# Patient Record
Sex: Female | Born: 1985 | Race: White | Hispanic: No
Health system: Southern US, Community
[De-identification: ages and names within clinical notes are randomized; demographics above are authoritative.]

---

## 2006-12-19 ENCOUNTER — Encounter: Admission: RE | Admit: 2006-12-19 | Discharge: 2006-12-19 | Payer: Self-pay | Admitting: Family Medicine

## 2007-01-19 ENCOUNTER — Encounter: Admission: RE | Admit: 2007-01-19 | Discharge: 2007-01-19 | Payer: Self-pay | Admitting: Family Medicine

## 2007-01-24 ENCOUNTER — Encounter: Admission: RE | Admit: 2007-01-24 | Discharge: 2007-01-24 | Payer: Self-pay | Admitting: Family Medicine

## 2008-02-27 IMAGING — CR DG CHEST 2V
2 series · 2 of 2 positions shown · non-contrast
Comparison: 12/19/06

CLINICAL DATA: Follow-up pneumonia.
 CHEST - 2 VIEW:

[view not recorded (1 of 2)]
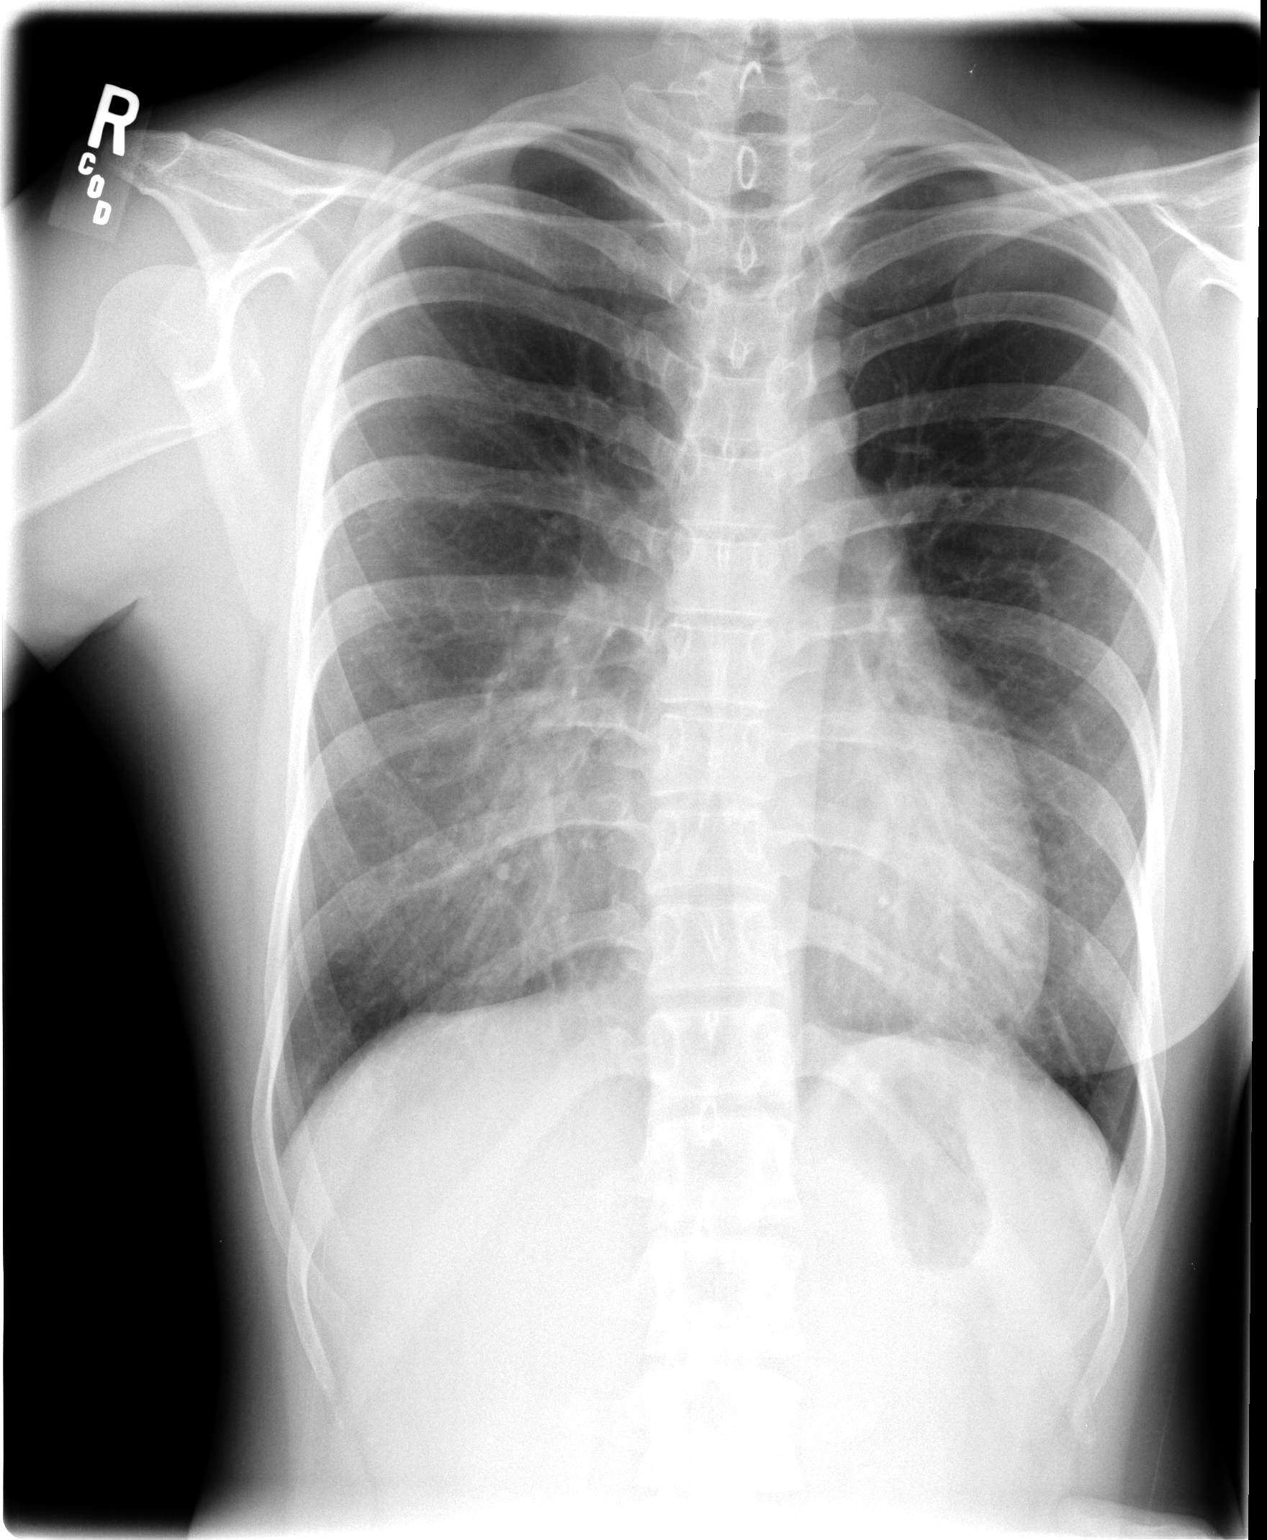

[view not recorded (2 of 2)]
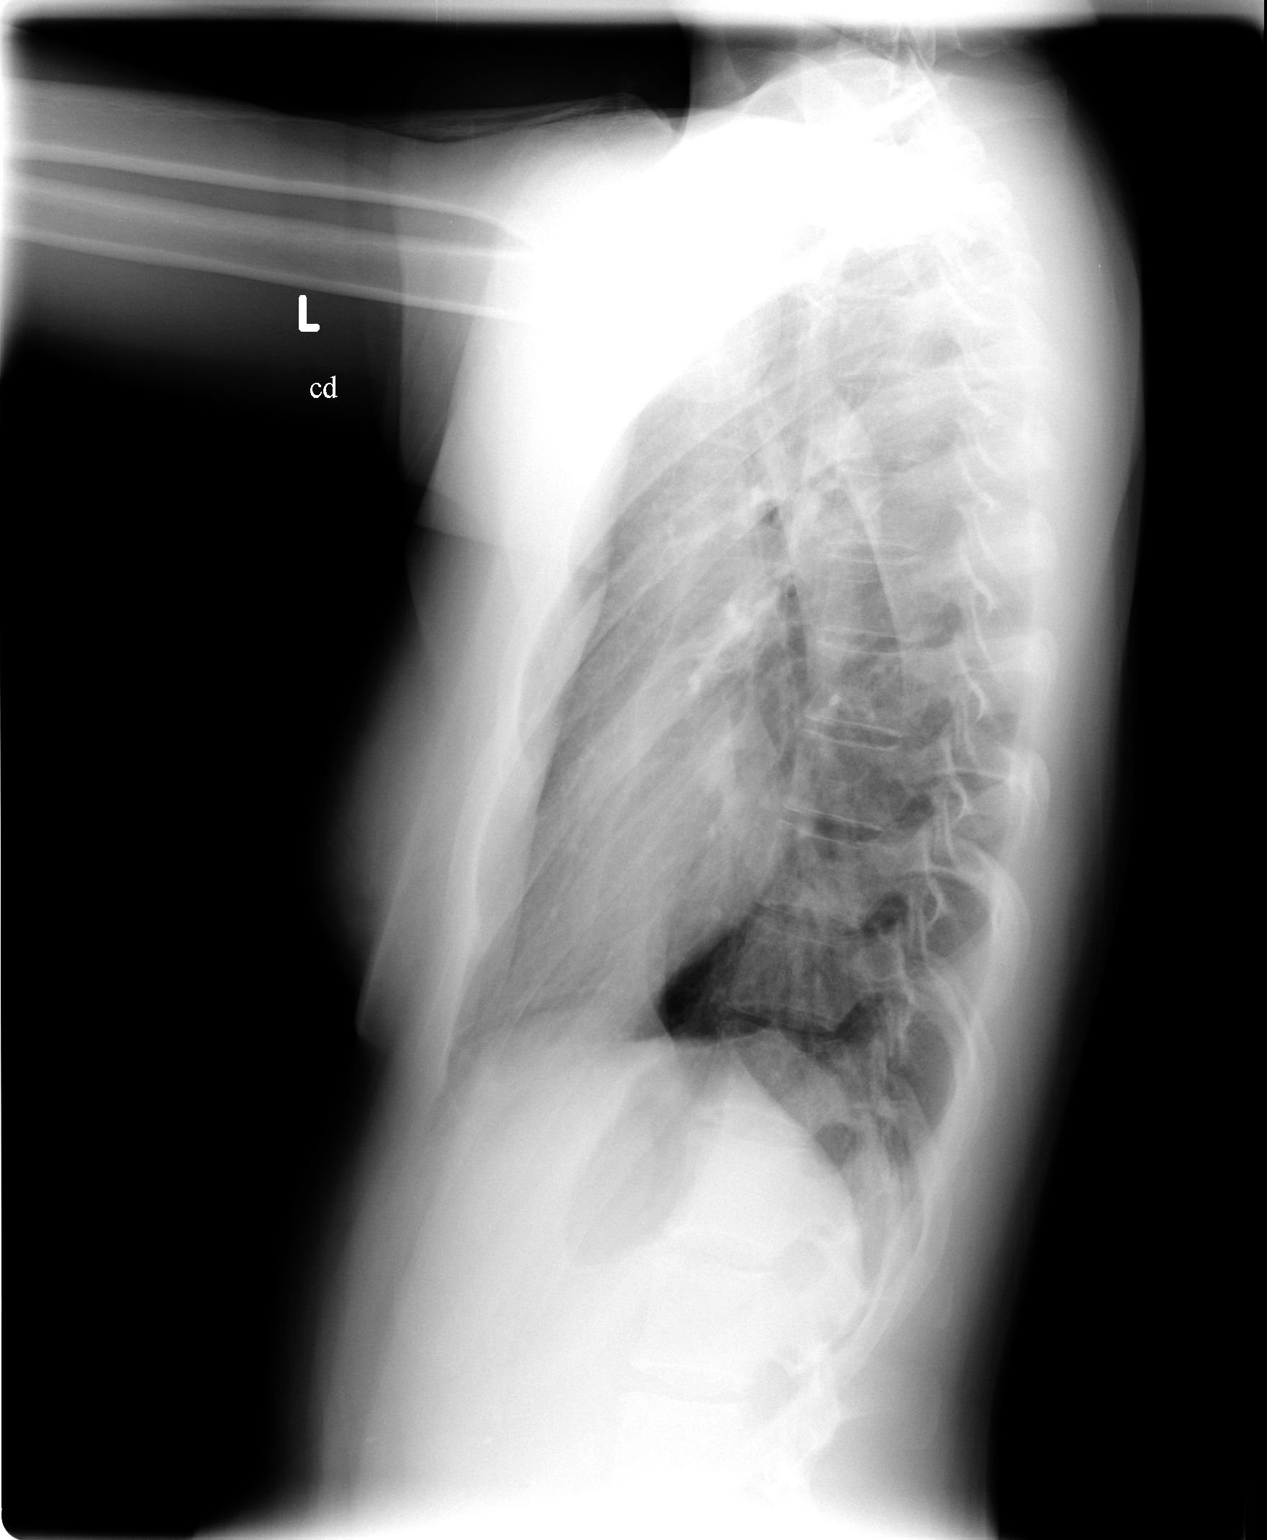

[2 of 2 positions shown; findings below may reference images not displayed]

FINDINGS: The right heart border remains obscured.  This may be, however, due to pectus deformity rather than right middle lobe pneumonia. The right lower lobe aeration has improved.  
 If it is important to determine whether or not the right middle lobe has cleared, CT will be necessary, unless there are old chest x-rays available for comparison.
IMPRESSION: 1.  Right lower lobe pneumonia essentially resolved. 
 2.  The right heart border remains obscured.  This may be due to pectus deformity rather than pneumonia.  See report.

## 2008-04-03 ENCOUNTER — Encounter: Admission: RE | Admit: 2008-04-03 | Discharge: 2008-04-03 | Payer: Self-pay | Admitting: Family Medicine

## 2009-05-12 IMAGING — CR DG WRIST COMPLETE 3+V*R*
4 series · 4 of 4 positions shown · non-contrast
Comparison: None

CLINICAL DATA: Lateral wrist pain for several weeks.  Recent
injury.

RIGHT WRIST - COMPLETE 3+ VIEW

[x wrist pa right]
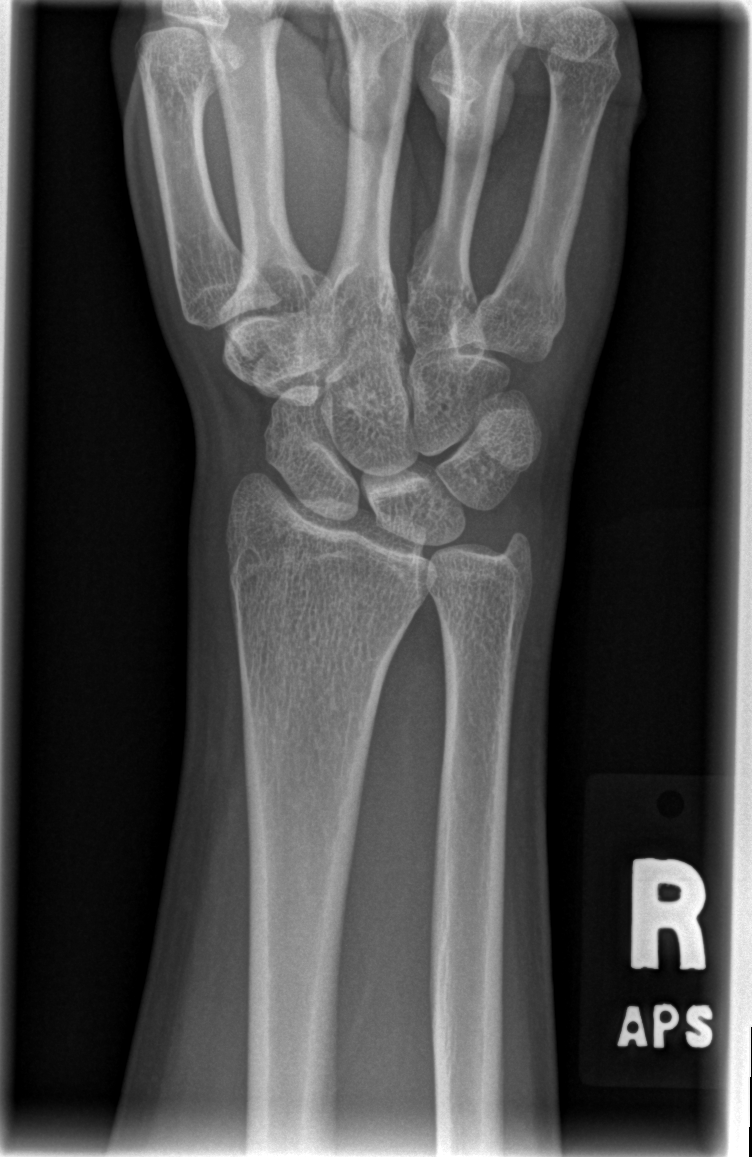

[x wrist obl right]
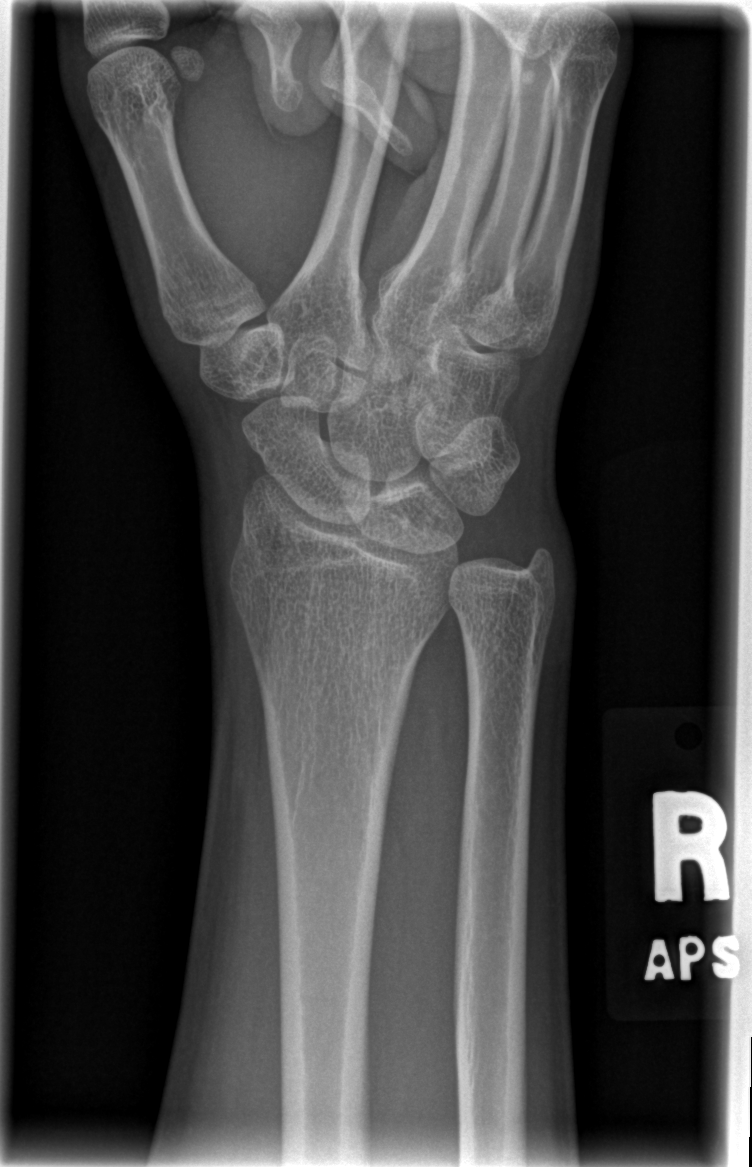

[x wrist lat right]
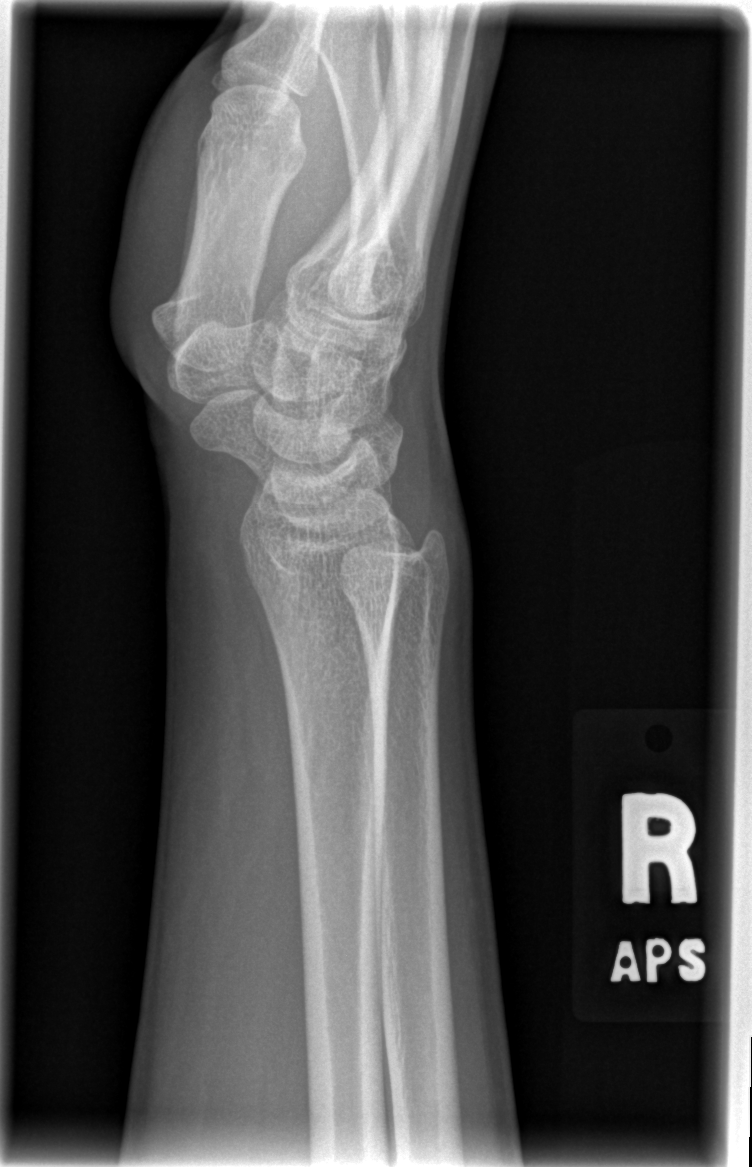

[x navicular]
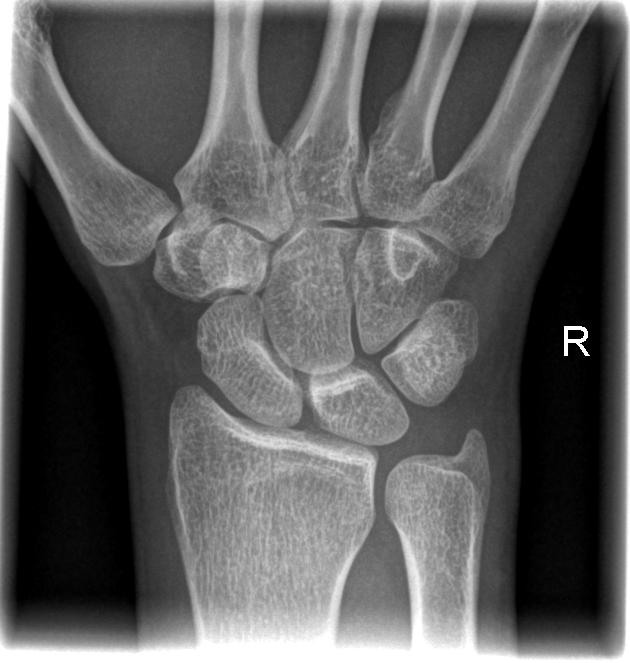

[4 of 4 positions shown; findings below may reference images not displayed]

FINDINGS: Mineralization and alignment are normal.  There is no
evidence of acute fracture or dislocation.  No focal soft tissue
swelling is evident.
IMPRESSION: No acute osseous findings.

## 2019-01-29 ENCOUNTER — Other Ambulatory Visit: Payer: Self-pay

## 2019-01-29 DIAGNOSIS — Z20822 Contact with and (suspected) exposure to covid-19: Secondary | ICD-10-CM

## 2019-01-30 LAB — NOVEL CORONAVIRUS, NAA: SARS-CoV-2, NAA: NOT DETECTED

## 2019-04-05 ENCOUNTER — Ambulatory Visit: Payer: Self-pay | Attending: Internal Medicine

## 2019-04-05 ENCOUNTER — Other Ambulatory Visit: Payer: Self-pay

## 2019-04-05 DIAGNOSIS — Z23 Encounter for immunization: Secondary | ICD-10-CM | POA: Insufficient documentation

## 2019-04-05 NOTE — Progress Notes (Signed)
   Covid-19 Vaccination Clinic  Name:  Hannahgrace Lalli    MRN: [id number] DOB: [birth year]  04/05/2019  Ms. Flinders was observed post Covid-19 immunization for 15 minutes without incidence. She was provided with Vaccine Information Sheet and instruction to access the V-Safe system.   Ms. Uncapher was instructed to call 911 with any severe reactions post vaccine: Marland Kitchen Difficulty breathing  . Swelling of your face and throat  . A fast heartbeat  . A bad rash all over your body  . Dizziness and weakness    Immunizations Administered    Name Date Dose VIS Date Route   Moderna COVID-19 Vaccine 04/05/2019  3:15 PM 0.5 mL 01/09/2019 Intramuscular   Manufacturer: Moderna   Lot: [id number]   NDC: 88891-694-50

## 2019-05-08 ENCOUNTER — Ambulatory Visit: Payer: Self-pay | Attending: Family

## 2019-05-08 DIAGNOSIS — Z23 Encounter for immunization: Secondary | ICD-10-CM

## 2019-05-08 NOTE — Progress Notes (Signed)
   Covid-19 Vaccination Clinic  Name:  Aly Seidenberg    MRN: [id number] DOB: [birth year]  05/08/2019  Ms. Ozawa was observed post Covid-19 immunization for 15 minutes without incident. She was provided with Vaccine Information Sheet and instruction to access the V-Safe system.   Ms. Wernli was instructed to call 911 with any severe reactions post vaccine: Marland Kitchen Difficulty breathing  . Swelling of face and throat  . A fast heartbeat  . A bad rash all over body  . Dizziness and weakness   Immunizations Administered    Name Date Dose VIS Date Route   Moderna COVID-19 Vaccine 05/08/2019 12:37 PM 0.5 mL 01/09/2019 Intramuscular   Manufacturer: Moderna   Lot: [id number]   NDC: 89784-784-12

## 2019-05-10 ENCOUNTER — Ambulatory Visit: Payer: Self-pay

## 2021-05-18 ENCOUNTER — Ambulatory Visit: Payer: BC Managed Care – PPO | Admitting: Podiatry

## 2021-05-18 ENCOUNTER — Encounter: Payer: Self-pay | Admitting: Podiatry

## 2021-05-18 ENCOUNTER — Ambulatory Visit (INDEPENDENT_AMBULATORY_CARE_PROVIDER_SITE_OTHER): Payer: BC Managed Care – PPO

## 2021-05-18 DIAGNOSIS — M7750 Other enthesopathy of unspecified foot: Secondary | ICD-10-CM

## 2021-05-18 DIAGNOSIS — K625 Hemorrhage of anus and rectum: Secondary | ICD-10-CM | POA: Insufficient documentation

## 2021-05-18 DIAGNOSIS — M7751 Other enthesopathy of right foot: Secondary | ICD-10-CM

## 2021-05-18 DIAGNOSIS — Z862 Personal history of diseases of the blood and blood-forming organs and certain disorders involving the immune mechanism: Secondary | ICD-10-CM | POA: Insufficient documentation

## 2021-05-18 DIAGNOSIS — J45909 Unspecified asthma, uncomplicated: Secondary | ICD-10-CM | POA: Insufficient documentation

## 2021-05-18 DIAGNOSIS — J4530 Mild persistent asthma, uncomplicated: Secondary | ICD-10-CM | POA: Insufficient documentation

## 2021-05-18 DIAGNOSIS — M7752 Other enthesopathy of left foot: Secondary | ICD-10-CM

## 2021-05-18 DIAGNOSIS — L409 Psoriasis, unspecified: Secondary | ICD-10-CM | POA: Insufficient documentation

## 2021-05-18 DIAGNOSIS — M722 Plantar fascial fibromatosis: Secondary | ICD-10-CM

## 2021-05-18 DIAGNOSIS — F988 Other specified behavioral and emotional disorders with onset usually occurring in childhood and adolescence: Secondary | ICD-10-CM | POA: Insufficient documentation

## 2021-05-18 DIAGNOSIS — J309 Allergic rhinitis, unspecified: Secondary | ICD-10-CM | POA: Insufficient documentation

## 2021-05-18 DIAGNOSIS — Z6829 Body mass index (BMI) 29.0-29.9, adult: Secondary | ICD-10-CM | POA: Insufficient documentation

## 2021-05-18 MED ORDER — MELOXICAM 15 MG PO TABS: 15.0000 mg | ORAL_TABLET | Freq: Every day | ORAL | 3 refills | Status: DC

## 2021-05-18 NOTE — Patient Instructions (Signed)

## 2021-05-18 NOTE — Progress Notes (Signed)
?  Subjective:  ?Patient ID: Brittany Wong, female    DOB: [birth year],  MRN: [id number] ? ?Chief Complaint  ?Patient presents with  ? Foot Pain  ?  New Patient foot pain  ? ? ?36 y.o. female presents with the above complaint. History confirmed with patient.  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  She has had bilateral heel pain for many years and goes off and on and is never really gotten better.  The left is worse than the right.  She has tried stretches and ice. ? ?Objective:  ?Physical Exam: ?warm, good capillary refill, no trophic changes or ulcerative lesions, normal DP and PT pulses, and normal sensory exam. ?Left Foot: point tenderness over the heel pad and point tenderness of the mid plantar fascia ?Right Foot: point tenderness over the heel pad ? ?No images are attached to the encounter. ? ?Radiographs: ?Multiple views x-ray of both feet: no fracture, dislocation, swelling or degenerative changes noted and plantar calcaneal spur only on the right side ?Assessment:  ? ?1. Bilateral plantar fasciitis   ? ? ? ?Plan:  ?Patient was evaluated and treated and all questions answered. ? ?Discussed the etiology and treatment options for plantar fasciitis including stretching, formal physical therapy, supportive shoegears such as a running shoe or sneaker, pre fabricated orthoses, injection therapy, and oral medications. We also discussed the role of surgical treatment of this for patients who do not improve after exhausting non-surgical treatment options. ? ? ?-XR reviewed with patient ?-Educated patient on stretching and icing of the affected limb ?-Injection delivered to the plantar fascia of the left foot. ?-Rx for meloxicam. Educated on use, risks and benefits of the medication ?-She also is a fairly flexible foot with compensation and reduction of the arch on weightbearing.  I think this is likely contributing the plantar fasciitis development.  We discussed supporting the medial longitudinal  arch with a custom molded foot orthosis and she will consider this.  I think this may be a good long-term solution for her ? ?After sterile prep with povidone-iodine solution and alcohol, the left heel was injected with 0.5cc 2% xylocaine plain, 0.5cc 0.5% marcaine plain, 5mg  triamcinolone acetonide, and 2mg  dexamethasone was injected along the medial plantar fascia at the insertion on the plantar calcaneus. The patient tolerated the procedure well without complication. ? ?Return in about 1 month (around 06/17/2021) for recheck plantar fasciitis.  ? ?

## 2021-06-15 ENCOUNTER — Ambulatory Visit: Payer: BC Managed Care – PPO | Admitting: Podiatry

## 2021-06-15 DIAGNOSIS — M722 Plantar fascial fibromatosis: Secondary | ICD-10-CM

## 2021-06-21 ENCOUNTER — Encounter: Payer: Self-pay | Admitting: Podiatry

## 2021-06-21 NOTE — Progress Notes (Signed)
?  Subjective:  ?Patient ID: Brittany Wong, female    DOB: [birth year],  MRN: [id number] ? ?Chief Complaint  ?Patient presents with  ? Plantar Fasciitis  ?  4 week follow up bilateral  ? ? ?36 y.o. female presents with the above complaint. History confirmed with patient.  Her pain is doing much better only really has pain when she stands for long time. ? ?Objective:  ?Physical Exam: ?warm, good capillary refill, no trophic changes or ulcerative lesions, normal DP and PT pulses, and normal sensory exam.  No pain on either heel or mid plantar fascia today ? ?Radiographs: ?Multiple views x-ray of both feet: no fracture, dislocation, swelling or degenerative changes noted and plantar calcaneal spur only on the right side ?Assessment:  ? ?1. Bilateral plantar fasciitis   ? ? ? ?Plan:  ?Patient was evaluated and treated and all questions answered. ? ?Doing much better advised her to continue her home therapy plan until she is pain-free for least 2 weeks.  We also discussed long-term arch support and she will consider this and call to schedule for casting when she is ready if she would like to pursue this.  Return to see me as needed if the pain returns. ?Return if symptoms worsen or fail to improve.  ? ?

## 2021-09-20 ENCOUNTER — Other Ambulatory Visit: Payer: Self-pay | Admitting: Podiatry

## 2023-06-16 ENCOUNTER — Encounter (INDEPENDENT_AMBULATORY_CARE_PROVIDER_SITE_OTHER): Payer: Self-pay | Admitting: Otolaryngology

## 2023-08-26 ENCOUNTER — Institutional Professional Consult (permissible substitution) (INDEPENDENT_AMBULATORY_CARE_PROVIDER_SITE_OTHER): Payer: Self-pay | Admitting: Otolaryngology

## 2023-09-08 ENCOUNTER — Telehealth (INDEPENDENT_AMBULATORY_CARE_PROVIDER_SITE_OTHER): Payer: Self-pay | Admitting: Otolaryngology

## 2023-09-08 NOTE — Telephone Encounter (Signed)
 Confirmed appt & location [phone] [id number]

## 2023-09-09 ENCOUNTER — Encounter (INDEPENDENT_AMBULATORY_CARE_PROVIDER_SITE_OTHER): Payer: Self-pay | Admitting: Otolaryngology

## 2023-09-09 ENCOUNTER — Ambulatory Visit (INDEPENDENT_AMBULATORY_CARE_PROVIDER_SITE_OTHER): Payer: Self-pay | Admitting: Otolaryngology

## 2023-09-09 VITALS — BP 119/79 | HR 71

## 2023-09-09 DIAGNOSIS — R0981 Nasal congestion: Secondary | ICD-10-CM | POA: Diagnosis not present

## 2023-09-09 DIAGNOSIS — R0982 Postnasal drip: Secondary | ICD-10-CM

## 2023-09-09 DIAGNOSIS — J342 Deviated nasal septum: Secondary | ICD-10-CM

## 2023-09-09 DIAGNOSIS — R519 Headache, unspecified: Secondary | ICD-10-CM

## 2023-09-09 DIAGNOSIS — J3089 Other allergic rhinitis: Secondary | ICD-10-CM

## 2023-09-09 DIAGNOSIS — J343 Hypertrophy of nasal turbinates: Secondary | ICD-10-CM

## 2023-09-09 DIAGNOSIS — J329 Chronic sinusitis, unspecified: Secondary | ICD-10-CM

## 2023-09-09 NOTE — Progress Notes (Signed)
 ENT CONSULT:  Reason for Consult: chronic nasal congestion frontal headaches  HPI: Discussed the use of AI scribe software for clinical note transcription with the patient, who gave verbal consent to proceed.  History of Present Illness Brittany Wong is a 38 year old female who presents with chronic nasal congestion and frontal headaches.  She has experienced worsening allergies and sinus issues since moving to [place] in 2008. She is on multiple allergy medications, including two nasal sprays and three allergy pills, specifically Zyrtec and montelukast. She also receives allergy shots, which she started a couple of years ago, and feels they have helped with mold, mildew, and dust allergies.  She describes a history of headaches located in the front of her forehead, which occur during the day and are resistant to medication. These headaches prevent her from napping and she manages the pain until bedtime. The headaches are particularly severe on the first day back to work and recur a couple of times during the week.  In 2012 or 2013, she experienced a nasal injury after passing out and hitting her nose on a wall, resulting in a bump that was not present before. A nurse assessed the injury at the time, and surgery was not required.  Her sense of smell is intact, and she reports that she can smell more than others might expect. She has attempted nasal rinsing with salt water but found it intolerable due to gagging.  Family history is notable for her brother having migraines during high school.     Records Reviewed:  Seen by [facility] for plantar fasciitis note from 06/15/21  Multiple visits with Allergy for shots documented     History reviewed. No pertinent past medical history.  History reviewed. No pertinent surgical history.  History reviewed. No pertinent family history.  Social History:  reports that she has never smoked. She has never used smokeless tobacco. No history  on file for alcohol use and drug use.  Allergies: No Known Allergies  Medications: I have reviewed the patient's current medications.  The PMH, PSH, Medications, Allergies, and SH were reviewed and updated.  ROS: Constitutional: Negative for fever, weight loss and weight gain. Cardiovascular: Negative for chest pain and dyspnea on exertion. Respiratory: Is not experiencing shortness of breath at rest. Gastrointestinal: Negative for nausea and vomiting. Neurological: Negative for headaches. Psychiatric: The patient is not nervous/anxious  Blood pressure 119/79, pulse 71, SpO2 96%. There is no height or weight on file to calculate BMI.  PHYSICAL EXAM:  Exam: General: Well-developed, well-nourished Respiratory Respiratory effort: Equal inspiration and expiration without stridor Cardiovascular Peripheral Vascular: Warm extremities with equal color/perfusion Eyes: No nystagmus with equal extraocular motion bilaterally Neuro/Psych/Balance: Patient oriented to person, place, and time; Appropriate mood and affect; Gait is intact with no imbalance; Cranial nerves I-XII are intact Head and Face Inspection: Normocephalic and atraumatic without mass or lesion Palpation: Facial skeleton intact without bony stepoffs Salivary Glands: No mass or tenderness Facial Strength: Facial motility symmetric and full bilaterally ENT Pinna: External ear intact and fully developed External canal: Canal is patent with intact skin Tympanic Membrane: Clear and mobile External Nose: No scar or anatomic deformity Internal Nose: Septum is deviated to the left. No polyp, or purulence. Mucosal edema and erythema present.  Bilateral inferior turbinate hypertrophy.  Lips, Teeth, and gums: Mucosa and teeth intact and viable TMJ: No pain to palpation with full mobility Oral cavity/oropharynx: No erythema or exudate, no lesions present Nasopharynx: No mass or lesion with  intact mucosa Neck Neck and Trachea:  Midline trachea without mass or lesion Thyroid: No mass or nodularity Lymphatics: No lymphadenopathy  Procedure:   PROCEDURE NOTE: nasal endoscopy  Preoperative diagnosis: chronic sinusitis symptoms  Postoperative diagnosis: same  Procedure: Diagnostic nasal endoscopy (68768)  Surgeon: Elena Larry, M.D.  Anesthesia: Topical lidocaine and Afrin  H&P REVIEW: The patient's history and physical were reviewed today prior to procedure. All medications were reviewed and updated as well. Complications: None Condition is stable throughout exam Indications and consent: The patient presents with symptoms of chronic sinusitis not responding to previous therapies. All the risks, benefits, and potential complications were reviewed with the patient preoperatively and informed consent was obtained. The time out was completed with confirmation of the correct procedure.   Procedure: The patient was seated upright in the clinic. Topical lidocaine and Afrin were applied to the nasal cavity. After adequate anesthesia had occurred, the rigid nasal endoscope was passed into the nasal cavity. The nasal mucosa, turbinates, septum, and sinus drainage pathways were visualized bilaterally. This revealed no purulence or significant secretions that might be cultured. There were no polyps or sites of significant inflammation. The mucosa was intact and there was no crusting present. The scope was then slowly withdrawn and the patient tolerated the procedure well. There were no complications or blood loss.    Studies Reviewed: CT chest 01/24/2007 Clinical Data: Follow up right middle lobe infiltrate, cough, asthma.   CT CHEST WITHOUT CONTRAST:  Technique: Multidetector CT imaging of the chest was performed following the standard protocol without IV contrast.   Comparison: None.   Findings: The lungs are well aerated. There is no evidence of infiltrate or effusion. He haziness of the right heart border noted on  chest x-ray appears to be due to the pectus deformity rather than infiltrate. No mediastinal or hilar adenopathy is seen.   IMPRESSION:   The lungs are clear. The haziness over the right heart border appears to be due to the patient's pectus deformity rather than lung infiltrate.   Assessment/Plan: Encounter Diagnoses  Name Primary?   Chronic nasal congestion Yes   Environmental and seasonal allergies    Nasal septal deviation    Hypertrophy of both inferior nasal turbinates    Post-nasal drip    Chronic sinusitis, unspecified location    Frontal headache     Assessment and Plan Assessment & Plan Chronic nasal congestion with allergic rhinitis and environmental allergies  Chronic nasal congestion likely exacerbated by environmental alllergies. Allergies to mold, mildew, dust, and cockroaches. Currently on allergy shots, two nasal sprays, and allergy pills (Zyrtec and montelukast). No nasal polyps or purulence observed on nasal endoscopy, but she had mucosal edema, septal deviation and ITH. No prior imaging of sinuses.  - Order sinus CT to assess for chronic inflammation. - Continue allergy shots, nasal sprays, and allergy pills. - Advise nasal saline rinses, though she reports difficulty with this method.  Frontal headaches Frontal headaches associated with chronic nasal congestion. Occur during the day and are resistant to medication. Advised that sinus disease may not correlate with headache improvement. - Review sinus CT results to determine correlation with headaches. - will consider referral to Neurology in the future   Deviated nasal septum ITH - medical management of nasal congestion        Thank you for allowing me to participate in the care of this patient. Please do not hesitate to contact me with any questions or concerns.   Elena Larry, MD Otolaryngology  [facility] Phone: [phone] Fax: [phone]    09/09/2023, 9:00 AM

## 2023-09-09 NOTE — Patient Instructions (Signed)
 Lloyd Huger Med Nasal Saline Rinse   - start nasal saline rinses with NeilMed Bottle available over the counter or online to help with nasal congestion

## 2023-10-27 ENCOUNTER — Ambulatory Visit (INDEPENDENT_AMBULATORY_CARE_PROVIDER_SITE_OTHER): Admitting: Otolaryngology
